# Patient Record
Sex: Female | Born: 1975 | Race: White | Hispanic: No | Marital: Married | Smoking: Never smoker
Health system: Southern US, Community
[De-identification: ages and names within clinical notes are randomized; demographics above are authoritative.]

## PROBLEM LIST (undated history)

## (undated) HISTORY — PX: AUGMENTATION MAMMAPLASTY: SUR837

---

## 2002-02-06 ENCOUNTER — Other Ambulatory Visit: Admission: RE | Admit: 2002-02-06 | Discharge: 2002-02-06 | Payer: Self-pay | Admitting: Obstetrics and Gynecology

## 2003-02-05 ENCOUNTER — Other Ambulatory Visit: Admission: RE | Admit: 2003-02-05 | Discharge: 2003-02-05 | Payer: Self-pay | Admitting: Obstetrics and Gynecology

## 2004-02-29 ENCOUNTER — Other Ambulatory Visit: Admission: RE | Admit: 2004-02-29 | Discharge: 2004-02-29 | Payer: Self-pay | Admitting: Obstetrics and Gynecology

## 2005-06-22 ENCOUNTER — Other Ambulatory Visit: Admission: RE | Admit: 2005-06-22 | Discharge: 2005-06-22 | Payer: Self-pay | Admitting: Obstetrics and Gynecology

## 2007-07-09 ENCOUNTER — Encounter (INDEPENDENT_AMBULATORY_CARE_PROVIDER_SITE_OTHER): Payer: Self-pay | Admitting: Obstetrics and Gynecology

## 2007-07-09 ENCOUNTER — Ambulatory Visit (HOSPITAL_COMMUNITY): Admission: RE | Admit: 2007-07-09 | Discharge: 2007-07-09 | Payer: Self-pay | Admitting: Obstetrics and Gynecology

## 2008-06-25 ENCOUNTER — Ambulatory Visit (HOSPITAL_COMMUNITY): Admission: RE | Admit: 2008-06-25 | Discharge: 2008-06-25 | Payer: Self-pay | Admitting: Obstetrics and Gynecology

## 2008-06-25 ENCOUNTER — Encounter (INDEPENDENT_AMBULATORY_CARE_PROVIDER_SITE_OTHER): Payer: Self-pay | Admitting: Obstetrics and Gynecology

## 2010-08-14 ENCOUNTER — Ambulatory Visit (HOSPITAL_COMMUNITY): Admission: AD | Admit: 2010-08-14 | Discharge: 2010-08-15 | Payer: Self-pay | Admitting: Obstetrics and Gynecology

## 2010-08-28 DEATH — deceased

## 2010-12-09 LAB — CBC
Hemoglobin: 15.2 g/dL — ABNORMAL HIGH (ref 12.0–15.0)
MCH: 31.8 pg (ref 26.0–34.0)
MCHC: 34.6 g/dL (ref 30.0–36.0)
RDW: 11.8 % (ref 11.5–15.5)

## 2011-02-10 NOTE — Op Note (Signed)
Kaitlin Curtis, TREVIZO            ACCOUNT NO.:  [id number]   MEDICAL RECORD NO.:  [id number]          PATIENT TYPE:  AMB   LOCATION:  [facility]                           FACILITY:  [facility]   PHYSICIAN:  Michelle L. 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   PROCEDURE:  Dilatation and evacuation with chromosome.   SURGEON:  Michelle L. Vincente Poli, M.D.   ANESTHESIA:  MAC with local.   FINDINGS:  Products of conception.   OPERATIVE PROCEDURE:  The patient was taken to the operating room.  She  was given her anesthesia.  She is prepped and draped.  A speculum was  inserted into the vagina.  A paracervical block was performed in  standard fashion.  The cervical internal os was gently dilated using  Pratt dilators.  A #7 suction cannula was inserted and suction curettage  was performed with retrieval of contents consistent with products of  conception.  The curette was removed.  A sharp curette was inserted and  the uterus was thoroughly curetted of all tissue.  A final suction  curettage was performed and the cavity was clean.  At the end of the  procedure, there was no uterine bleeding noted.  All instruments were  removed from the vagina.  The patient tolerated the procedure extremely  well.  A portion of the specimen was sent for routine pathology.  Another portion was sent for chromosome karyotype because of the concern  of possible molar pregnancy given ultrasound appearance of the products  of conception.  Estimated blood loss was 50 mL.  The patient tolerated  the procedure well and went to the recovery room in stable condition.  All sponge, lap and instrument counts were correct x2.      Michelle L. Vincente Poli, M.D.  Electronically Signed     MLG/MEDQ  D:  07/09/2007  T:   07/09/2007  Job:  [id number]

## 2011-02-10 NOTE — Op Note (Signed)
NAMENASHIA, Kaitlin Curtis            ACCOUNT NO.:  [id number]   MEDICAL RECORD NO.:  [id number]          PATIENT TYPE:  AMB   LOCATION:  [facility]C                           [facility]   PHYSICIAN:  Michelle L. Grewal, M.D.DATE OF BIRTH:  July 22, 1976   DATE OF PROCEDURE:  06/25/2008  DATE OF DISCHARGE:                               OPERATIVE REPORT   PREOPERATIVE DIAGNOSES:  Missed abortion, Rh positive.   POSTOPERATIVE DIAGNOSES:  Missed abortion, Rh positive.   PROCEDURE:  Dilatation and evacuation with chromosomes.   SURGEON:  Michelle L. Vincente Poli, MD.   ANESTHESIA:  MAC with local.   FINDINGS:  Products of conception sent to pathology.   ESTIMATED BLOOD LOSS:  Minimal.   COMPLICATIONS:  None.   DESCRIPTION OF PROCEDURE:  The patient was taken to the operating room.  She was given sedation.  She was placed in the lithotomy position.  In-  and-out catheter was used to empty the bladder.  A speculum was inserted  into the vagina.  Cervix was grasped with a tenaculum.  A paracervical  block was performed in standard fashion.  A #7-suction cannula was  inserted and a suction curettage was performed with retrieval tissue  consistent with POC.  Also, there were some old clots at the top of her  uterus consistent with her subchorionic hemorrhage.  The cannula was  removed.  A sharp curette was inserted.  The uterus was thoroughly  curetted of all tissue.  A final suction curettage was performed.  The  uterine cavity was cleaned.  All sponge, lap, and instrument counts were  correct x2.  All instruments have been removed from the vagina.  Half of  the tissue was sent for chromosome karyotype.  The patient tolerated  procedure very well.      Michelle L. Vincente Poli, M.D.  Electronically Signed     MLG/MEDQ  D:  06/25/2008  T:  06/26/2008  Job:  [id number]

## 2011-02-18 ENCOUNTER — Other Ambulatory Visit: Payer: Self-pay | Admitting: Obstetrics and Gynecology

## 2011-02-18 ENCOUNTER — Ambulatory Visit (HOSPITAL_COMMUNITY)
Admission: RE | Admit: 2011-02-18 | Discharge: 2011-02-18 | Disposition: A | Discharge status: Home / Self Care | Payer: BC Managed Care – PPO | Source: Ambulatory Visit | Attending: Obstetrics and Gynecology | Admitting: Obstetrics and Gynecology

## 2011-02-18 ENCOUNTER — Ambulatory Visit (HOSPITAL_COMMUNITY): Payer: BC Managed Care – PPO

## 2011-02-18 DIAGNOSIS — O039 Complete or unspecified spontaneous abortion without complication: Secondary | ICD-10-CM

## 2011-02-18 DIAGNOSIS — O021 Missed abortion: Secondary | ICD-10-CM | POA: Insufficient documentation

## 2011-02-18 LAB — CBC
MCH: 31.2 pg (ref 26.0–34.0)
MCV: 90.8 fL (ref 78.0–100.0)
Platelets: 201 10*3/uL (ref 150–400)
RDW: 12.3 % (ref 11.5–15.5)
WBC: 5 10*3/uL (ref 4.0–10.5)

## 2011-02-20 NOTE — Op Note (Signed)
  NAMETENNA, LACKO            ACCOUNT NO.:  [id number]  MEDICAL RECORD NO.:  [id number]           PATIENT TYPE:  O  LOCATION:  [facility]                          [facility]  PHYSICIAN:  Naethan Bracewell L. Makiya Jeune, M.D.DATE OF BIRTH:  1976/03/24  DATE OF PROCEDURE:  02/18/2011 DATE OF DISCHARGE:                              OPERATIVE REPORT   PREOPERATIVE DIAGNOSES:  Missed abortion, Rh positive, 9 weeks' pregnant and recurrent pregnancy loss.  POSTOPERATIVE DIAGNOSES:  Missed abortion, Rh positive, 9 weeks' pregnant and recurrent pregnancy loss.  PROCEDURE:  D and E with ultrasound guidance and chromosome analysis.  SURGEON:  Haddy Mullinax L. Vincente Poli, MD  ANESTHESIA:  MAC with paracervical block.  PROCEDURE:  The patient was taken to the operating room.  She was administered anesthesia.  She was prepped and draped.  In-and-out catheter was used to empty the bladder.  The speculum was inserted into the vagina.  The cervix was grasped with a tenaculum and a paracervical block was performed in standard fashion.  The cervical internal os was gently dilated with Shawnie Pons dilators under direct ultrasound visualization and an #8 suction cannula was inserted into the uterine cavity and with using ultrasound as direct guidance, a thorough suction curettage was performed with retrieval contents consistent with products of conception.  A sharp curette was inserted into the uterus.  The uterus was thoroughly cleaned of all tissue.  Bleeding was minimal.  The patient tolerated the procedure well.  All sponge, lap and instrument counts were correct x2.  She went to recovery room in stable condition.     Yahmir Sokolov L. Vincente Poli, M.D.     Florestine Avers  D:  02/18/2011  T:  02/18/2011  Job:  [id number]  Electronically Signed by Marcelle Overlie M.D. on 02/20/2011 07:19:27 AM

## 2011-02-27 DEATH — deceased

## 2011-06-29 LAB — CBC
Platelets: 243
RDW: 11.7
WBC: 5.6

## 2011-07-09 LAB — CBC
HCT: 42.7
Hemoglobin: 14.8
RBC: 4.65

## 2012-09-14 ENCOUNTER — Other Ambulatory Visit: Payer: Self-pay | Admitting: Obstetrics and Gynecology

## 2013-09-15 ENCOUNTER — Other Ambulatory Visit: Payer: Self-pay | Admitting: Obstetrics and Gynecology

## 2014-09-17 ENCOUNTER — Other Ambulatory Visit: Payer: Self-pay | Admitting: Obstetrics and Gynecology

## 2014-09-18 LAB — CYTOLOGY - PAP

## 2014-10-16 ENCOUNTER — Other Ambulatory Visit: Payer: Self-pay | Admitting: Obstetrics and Gynecology

## 2014-10-16 DIAGNOSIS — R928 Other abnormal and inconclusive findings on diagnostic imaging of breast: Secondary | ICD-10-CM

## 2014-10-23 ENCOUNTER — Ambulatory Visit
Admission: RE | Admit: 2014-10-23 | Discharge: 2014-10-23 | Disposition: A | Discharge status: Home / Self Care | Payer: BLUE CROSS/BLUE SHIELD | Source: Ambulatory Visit | Attending: Obstetrics and Gynecology | Admitting: Obstetrics and Gynecology

## 2014-10-23 DIAGNOSIS — R928 Other abnormal and inconclusive findings on diagnostic imaging of breast: Secondary | ICD-10-CM

## 2015-12-30 DIAGNOSIS — H60392 Other infective otitis externa, left ear: Secondary | ICD-10-CM | POA: Diagnosis not present

## 2015-12-30 DIAGNOSIS — H66002 Acute suppurative otitis media without spontaneous rupture of ear drum, left ear: Secondary | ICD-10-CM | POA: Diagnosis not present

## 2016-02-18 DIAGNOSIS — Z111 Encounter for screening for respiratory tuberculosis: Secondary | ICD-10-CM | POA: Diagnosis not present

## 2016-04-06 DIAGNOSIS — B078 Other viral warts: Secondary | ICD-10-CM | POA: Diagnosis not present

## 2016-07-20 DIAGNOSIS — L720 Epidermal cyst: Secondary | ICD-10-CM | POA: Diagnosis not present

## 2016-08-06 DIAGNOSIS — M25562 Pain in left knee: Secondary | ICD-10-CM | POA: Diagnosis not present

## 2016-08-06 DIAGNOSIS — Z681 Body mass index (BMI) 19 or less, adult: Secondary | ICD-10-CM | POA: Diagnosis not present

## 2016-09-01 ENCOUNTER — Encounter: Payer: Self-pay | Admitting: Sports Medicine

## 2016-09-01 ENCOUNTER — Ambulatory Visit (INDEPENDENT_AMBULATORY_CARE_PROVIDER_SITE_OTHER): Payer: BLUE CROSS/BLUE SHIELD | Admitting: Sports Medicine

## 2016-09-01 ENCOUNTER — Ambulatory Visit: Payer: Self-pay

## 2016-09-01 VITALS — BP 123/60 | Ht 64.5 in | Wt 110.0 lb

## 2016-09-01 DIAGNOSIS — M25562 Pain in left knee: Secondary | ICD-10-CM | POA: Diagnosis not present

## 2016-09-01 DIAGNOSIS — L700 Acne vulgaris: Secondary | ICD-10-CM | POA: Diagnosis not present

## 2016-09-01 DIAGNOSIS — D1801 Hemangioma of skin and subcutaneous tissue: Secondary | ICD-10-CM | POA: Diagnosis not present

## 2016-09-01 DIAGNOSIS — D2239 Melanocytic nevi of other parts of face: Secondary | ICD-10-CM | POA: Diagnosis not present

## 2016-09-01 DIAGNOSIS — M7052 Other bursitis of knee, left knee: Secondary | ICD-10-CM

## 2016-09-01 DIAGNOSIS — M25561 Pain in right knee: Secondary | ICD-10-CM | POA: Diagnosis not present

## 2016-09-01 DIAGNOSIS — L308 Other specified dermatitis: Secondary | ICD-10-CM | POA: Diagnosis not present

## 2016-09-01 DIAGNOSIS — R269 Unspecified abnormalities of gait and mobility: Secondary | ICD-10-CM

## 2016-09-01 NOTE — Assessment & Plan Note (Signed)
Eccentric exercise protocol Emphasize calf raises Emphasize hamstring with diver and extender  Be cautious with excess stretching as this is more likely injured in yoga  Use compression sleeve  Gradually resume running over the next 6 weeks and return for recheck

## 2016-09-01 NOTE — Assessment & Plan Note (Signed)
Her pronation and genu valgus may have contributed to this not healing  Sports insoles Scaphoid pad added  This improved her gait and controlled her pronation

## 2016-09-01 NOTE — Progress Notes (Signed)
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 Palpation normal with no warmth or joint line tenderness or patellar tenderness or condyle tenderness. ROM normal in flexion and extension and lower leg rotation. Ligaments with solid consistent endpoints including ACL, PCL, LCL, MCL. Negative Mcmurray's and provocative meniscal tests. Non painful patellar compression. Patellar and quadriceps tendons unremarkable. Hamstring and quadriceps strength is normal.  Deep palpation of the hamstring and calf muscles does not reveal any pain  Feet There is flattening of the transverse arch on the right Separation of the medial and lateral column  On the left there is splaying between toes 1 and 2 Rotation of the lateral column  Prominent first MTP joints bilaterally  Running gait She has turning out of both feet more on the left This causes dynamic pronation of the midfoot and forefoot There is mild dynamic genu valgus   Ultrasound examination of the left knee Suprapatellar pouch normal with no effusion Quadriceps and patellar tendons normal Medial and lateral menisci normal The popliteal space  shows bursal swelling well localized between the insertion of the gastrocnemius and the semimembranosus in the popliteal space Hypoechoic bursal swelling measures 1 x 3 cm  Summary: ultrasound is consistent with gastrocnemius-semimembranosus bursitis

## 2016-09-08 DIAGNOSIS — Z0000 Encounter for general adult medical examination without abnormal findings: Secondary | ICD-10-CM | POA: Diagnosis not present

## 2016-09-08 DIAGNOSIS — Z681 Body mass index (BMI) 19 or less, adult: Secondary | ICD-10-CM | POA: Diagnosis not present

## 2016-09-08 DIAGNOSIS — M542 Cervicalgia: Secondary | ICD-10-CM | POA: Diagnosis not present

## 2016-09-08 DIAGNOSIS — R208 Other disturbances of skin sensation: Secondary | ICD-10-CM | POA: Diagnosis not present

## 2016-09-09 DIAGNOSIS — R209 Unspecified disturbances of skin sensation: Secondary | ICD-10-CM | POA: Diagnosis not present

## 2016-10-19 DIAGNOSIS — Z681 Body mass index (BMI) 19 or less, adult: Secondary | ICD-10-CM | POA: Diagnosis not present

## 2016-10-19 DIAGNOSIS — Z01419 Encounter for gynecological examination (general) (routine) without abnormal findings: Secondary | ICD-10-CM | POA: Diagnosis not present

## 2016-10-19 DIAGNOSIS — Z1231 Encounter for screening mammogram for malignant neoplasm of breast: Secondary | ICD-10-CM | POA: Diagnosis not present

## 2016-10-20 ENCOUNTER — Ambulatory Visit (INDEPENDENT_AMBULATORY_CARE_PROVIDER_SITE_OTHER): Payer: BLUE CROSS/BLUE SHIELD | Admitting: Sports Medicine

## 2016-10-20 ENCOUNTER — Encounter: Payer: Self-pay | Admitting: Sports Medicine

## 2016-10-20 ENCOUNTER — Other Ambulatory Visit: Payer: Self-pay | Admitting: *Deleted

## 2016-10-20 DIAGNOSIS — R269 Unspecified abnormalities of gait and mobility: Secondary | ICD-10-CM

## 2016-10-20 DIAGNOSIS — M7052 Other bursitis of knee, left knee: Secondary | ICD-10-CM

## 2016-10-20 NOTE — Assessment & Plan Note (Signed)
Much improved gait mechanics with inserts   Continue to use these  Reck 6 weeks

## 2016-10-20 NOTE — Patient Instructions (Signed)
Hamstring curls and swings Gradually add weight  Extender exercise use ankle weight and gradually increase number  Backpack with 10 bls to start and do you 1 leg heel raises on a step  Run easy but feel free to start doing this as long as no limp no progressive pain  6 weeks let's repeat scan and see if this has healed

## 2016-10-20 NOTE — Assessment & Plan Note (Signed)
This is steadily improving A lot of progress in 6 weeks  Expect she should be ready to go back to full training in 6 more weeks of HEP and insert usage

## 2016-10-20 NOTE — Progress Notes (Signed)
F/u: Left calf/ post. Knee pain  Patient followed for what we found to be semimembranosus- gastrocnemius burisits on [place].  Presnted as post. Knee pain. Now pain has stopped at rest. No pain at night. Sharp pains have stopped.  With running able to go on short runs with minimal pain. Has not tried longer runs yet.  Doing rehab exercises daily with no pain. Now able to RT yoga and pilates with no pain.  ROS No locking No giving way No swelling of knee  PE Fit, thin W F in NAD BP 135/75   Pulse 67   Ht 5' 4.5" (1.638 m)   Wt 110 lb (49.9 kg)   BMI 18.59 kg/m   Left Knee: Normal to inspection with no erythema or effusion or obvious bony abnormalities. Palpation normal with no warmth or joint line tenderness or patellar tenderness or condyle tenderness. ROM normal in flexion and extension and lower leg rotation. Ligaments with solid consistent endpoints including ACL, PCL, LCL, MCL. Negative Mcmurray's and provocative meniscal tests. Non painful patellar compression. Patellar and quadriceps tendons unremarkable. Hamstring and quadriceps strength is normal.  Strength testing is good. Feels tightness on HS resistance Calf raise w no tightness Hip abd. Strong  Running gait shows that inserts are controlling the excess pronation and looks improved - no real genu valgus today

## 2016-11-01 DIAGNOSIS — J019 Acute sinusitis, unspecified: Secondary | ICD-10-CM | POA: Diagnosis not present

## 2016-11-02 DIAGNOSIS — L308 Other specified dermatitis: Secondary | ICD-10-CM | POA: Diagnosis not present

## 2016-11-02 DIAGNOSIS — L738 Other specified follicular disorders: Secondary | ICD-10-CM | POA: Diagnosis not present

## 2016-11-02 DIAGNOSIS — L282 Other prurigo: Secondary | ICD-10-CM | POA: Diagnosis not present

## 2016-11-02 DIAGNOSIS — R21 Rash and other nonspecific skin eruption: Secondary | ICD-10-CM | POA: Diagnosis not present

## 2016-11-05 DIAGNOSIS — H60502 Unspecified acute noninfective otitis externa, left ear: Secondary | ICD-10-CM | POA: Diagnosis not present

## 2016-11-05 DIAGNOSIS — J018 Other acute sinusitis: Secondary | ICD-10-CM | POA: Diagnosis not present

## 2016-11-30 DIAGNOSIS — L738 Other specified follicular disorders: Secondary | ICD-10-CM | POA: Diagnosis not present

## 2016-11-30 DIAGNOSIS — L282 Other prurigo: Secondary | ICD-10-CM | POA: Diagnosis not present

## 2016-11-30 DIAGNOSIS — L308 Other specified dermatitis: Secondary | ICD-10-CM | POA: Diagnosis not present

## 2016-12-17 ENCOUNTER — Ambulatory Visit: Payer: Self-pay

## 2016-12-17 ENCOUNTER — Encounter: Payer: Self-pay | Admitting: Sports Medicine

## 2016-12-17 ENCOUNTER — Ambulatory Visit (INDEPENDENT_AMBULATORY_CARE_PROVIDER_SITE_OTHER): Payer: BLUE CROSS/BLUE SHIELD | Admitting: Sports Medicine

## 2016-12-17 ENCOUNTER — Encounter (INDEPENDENT_AMBULATORY_CARE_PROVIDER_SITE_OTHER): Payer: Self-pay

## 2016-12-17 VITALS — BP 120/77 | HR 75 | Ht 64.5 in | Wt 110.0 lb

## 2016-12-17 DIAGNOSIS — M7052 Other bursitis of knee, left knee: Secondary | ICD-10-CM

## 2016-12-17 DIAGNOSIS — R269 Unspecified abnormalities of gait and mobility: Secondary | ICD-10-CM

## 2016-12-17 NOTE — Progress Notes (Signed)
CC: left post knee pain  F/U of semimembranosus/ gastroc bursitis Walking w no pain up to 4 miles No pain on squats No pain with biking or easy step classes Still feels tight when she tries to run Back for reck  ROS No knee locking No giving way No swelling  PE Thin, athletic F BP 120/77   Pulse 75   Ht 5' 4.5" (1.638 m)   Wt 110 lb (49.9 kg)   BMI 18.59 kg/m   Knee:Left Normal to inspection with no erythema or effusion or obvious bony abnormalities. Palpation normal with no warmth or joint line tenderness or patellar tenderness or condyle tenderness. ROM normal in flexion and extension and lower leg rotation. Ligaments with solid consistent endpoints including ACL, PCL, LCL, MCL. Negative Mcmurray's and provocative meniscal tests. Non painful patellar compression. Patellar and quadriceps tendons unremarkable. Hamstring and quadriceps strength is normal.  Calf non tender Good strength n o bakers cyst  Running gait is neutral  Ultrasound of Left Calf and Knee  The gastrocnemius is scanned from mid calf to insertion behind knee There is some hyperechoic change in proximal tendon Semimembranosus and tendinosus visualized to distal insertion and no tear or abnormalituy Bursal swelling has resolved  Complete scan of knee was unremarkable  Impression - resolving gastrocnemius/ semimembranosus bursitis with residual scar tissue in proximal gastrocnemius  Ultrasound and interpretation by Wolfgang Phoenix. Oneida Alar, MD

## 2016-12-17 NOTE — Assessment & Plan Note (Signed)
Cont with insoles as they seem to control gait

## 2016-12-17 NOTE — Assessment & Plan Note (Signed)
This is much improved on exam and [place]  Keep up calf raises  Wear compression  Ease back into run/walk as long as pain < 3/10

## 2017-02-16 DIAGNOSIS — Z111 Encounter for screening for respiratory tuberculosis: Secondary | ICD-10-CM | POA: Diagnosis not present

## 2017-03-29 DIAGNOSIS — H60392 Other infective otitis externa, left ear: Secondary | ICD-10-CM | POA: Diagnosis not present

## 2017-04-06 DIAGNOSIS — H66002 Acute suppurative otitis media without spontaneous rupture of ear drum, left ear: Secondary | ICD-10-CM | POA: Diagnosis not present

## 2017-04-06 DIAGNOSIS — H60392 Other infective otitis externa, left ear: Secondary | ICD-10-CM | POA: Diagnosis not present

## 2017-09-30 DIAGNOSIS — R208 Other disturbances of skin sensation: Secondary | ICD-10-CM | POA: Diagnosis not present

## 2017-09-30 DIAGNOSIS — M542 Cervicalgia: Secondary | ICD-10-CM | POA: Diagnosis not present

## 2017-09-30 DIAGNOSIS — G5601 Carpal tunnel syndrome, right upper limb: Secondary | ICD-10-CM | POA: Diagnosis not present

## 2017-09-30 DIAGNOSIS — Z681 Body mass index (BMI) 19 or less, adult: Secondary | ICD-10-CM | POA: Diagnosis not present

## 2017-10-22 DIAGNOSIS — Z1231 Encounter for screening mammogram for malignant neoplasm of breast: Secondary | ICD-10-CM | POA: Diagnosis not present

## 2017-10-22 DIAGNOSIS — Z681 Body mass index (BMI) 19 or less, adult: Secondary | ICD-10-CM | POA: Diagnosis not present

## 2017-10-22 DIAGNOSIS — Z01419 Encounter for gynecological examination (general) (routine) without abnormal findings: Secondary | ICD-10-CM | POA: Diagnosis not present

## 2017-10-22 DIAGNOSIS — Z1212 Encounter for screening for malignant neoplasm of rectum: Secondary | ICD-10-CM | POA: Diagnosis not present

## 2017-10-27 DIAGNOSIS — L853 Xerosis cutis: Secondary | ICD-10-CM | POA: Diagnosis not present

## 2017-10-27 DIAGNOSIS — D225 Melanocytic nevi of trunk: Secondary | ICD-10-CM | POA: Diagnosis not present

## 2017-10-27 DIAGNOSIS — D2262 Melanocytic nevi of left upper limb, including shoulder: Secondary | ICD-10-CM | POA: Diagnosis not present

## 2017-10-27 DIAGNOSIS — L700 Acne vulgaris: Secondary | ICD-10-CM | POA: Diagnosis not present

## 2017-11-17 DIAGNOSIS — H66002 Acute suppurative otitis media without spontaneous rupture of ear drum, left ear: Secondary | ICD-10-CM | POA: Diagnosis not present

## 2017-11-17 DIAGNOSIS — J0140 Acute pansinusitis, unspecified: Secondary | ICD-10-CM | POA: Diagnosis not present

## 2018-02-16 DIAGNOSIS — Z111 Encounter for screening for respiratory tuberculosis: Secondary | ICD-10-CM | POA: Diagnosis not present

## 2018-03-08 DIAGNOSIS — Z111 Encounter for screening for respiratory tuberculosis: Secondary | ICD-10-CM | POA: Diagnosis not present

## 2018-06-02 DIAGNOSIS — J0100 Acute maxillary sinusitis, unspecified: Secondary | ICD-10-CM | POA: Diagnosis not present

## 2018-06-02 DIAGNOSIS — Z23 Encounter for immunization: Secondary | ICD-10-CM | POA: Diagnosis not present

## 2018-06-27 DIAGNOSIS — M6281 Muscle weakness (generalized): Secondary | ICD-10-CM | POA: Diagnosis not present

## 2018-06-27 DIAGNOSIS — M2560 Stiffness of unspecified joint, not elsewhere classified: Secondary | ICD-10-CM | POA: Diagnosis not present

## 2018-06-27 DIAGNOSIS — G5620 Lesion of ulnar nerve, unspecified upper limb: Secondary | ICD-10-CM | POA: Diagnosis not present

## 2018-06-27 DIAGNOSIS — M542 Cervicalgia: Secondary | ICD-10-CM | POA: Diagnosis not present

## 2018-07-01 DIAGNOSIS — M542 Cervicalgia: Secondary | ICD-10-CM | POA: Diagnosis not present

## 2018-07-01 DIAGNOSIS — M2560 Stiffness of unspecified joint, not elsewhere classified: Secondary | ICD-10-CM | POA: Diagnosis not present

## 2018-07-01 DIAGNOSIS — G5620 Lesion of ulnar nerve, unspecified upper limb: Secondary | ICD-10-CM | POA: Diagnosis not present

## 2018-07-01 DIAGNOSIS — M6281 Muscle weakness (generalized): Secondary | ICD-10-CM | POA: Diagnosis not present

## 2018-07-04 DIAGNOSIS — G5620 Lesion of ulnar nerve, unspecified upper limb: Secondary | ICD-10-CM | POA: Diagnosis not present

## 2018-07-04 DIAGNOSIS — M542 Cervicalgia: Secondary | ICD-10-CM | POA: Diagnosis not present

## 2018-07-04 DIAGNOSIS — M6281 Muscle weakness (generalized): Secondary | ICD-10-CM | POA: Diagnosis not present

## 2018-07-04 DIAGNOSIS — M2560 Stiffness of unspecified joint, not elsewhere classified: Secondary | ICD-10-CM | POA: Diagnosis not present

## 2018-07-08 DIAGNOSIS — M542 Cervicalgia: Secondary | ICD-10-CM | POA: Diagnosis not present

## 2018-07-08 DIAGNOSIS — M2560 Stiffness of unspecified joint, not elsewhere classified: Secondary | ICD-10-CM | POA: Diagnosis not present

## 2018-07-08 DIAGNOSIS — G5620 Lesion of ulnar nerve, unspecified upper limb: Secondary | ICD-10-CM | POA: Diagnosis not present

## 2018-07-08 DIAGNOSIS — M6281 Muscle weakness (generalized): Secondary | ICD-10-CM | POA: Diagnosis not present

## 2018-07-15 DIAGNOSIS — G5620 Lesion of ulnar nerve, unspecified upper limb: Secondary | ICD-10-CM | POA: Diagnosis not present

## 2018-07-15 DIAGNOSIS — M542 Cervicalgia: Secondary | ICD-10-CM | POA: Diagnosis not present

## 2018-07-15 DIAGNOSIS — M6281 Muscle weakness (generalized): Secondary | ICD-10-CM | POA: Diagnosis not present

## 2018-07-15 DIAGNOSIS — M2560 Stiffness of unspecified joint, not elsewhere classified: Secondary | ICD-10-CM | POA: Diagnosis not present

## 2018-09-19 DIAGNOSIS — J069 Acute upper respiratory infection, unspecified: Secondary | ICD-10-CM | POA: Diagnosis not present

## 2018-10-09 DIAGNOSIS — R05 Cough: Secondary | ICD-10-CM | POA: Diagnosis not present

## 2018-10-09 DIAGNOSIS — J069 Acute upper respiratory infection, unspecified: Secondary | ICD-10-CM | POA: Diagnosis not present

## 2018-11-18 DIAGNOSIS — R1903 Right lower quadrant abdominal swelling, mass and lump: Secondary | ICD-10-CM | POA: Diagnosis not present

## 2018-11-18 DIAGNOSIS — Z01419 Encounter for gynecological examination (general) (routine) without abnormal findings: Secondary | ICD-10-CM | POA: Diagnosis not present

## 2018-11-18 DIAGNOSIS — Z1212 Encounter for screening for malignant neoplasm of rectum: Secondary | ICD-10-CM | POA: Diagnosis not present

## 2018-11-18 DIAGNOSIS — Z1231 Encounter for screening mammogram for malignant neoplasm of breast: Secondary | ICD-10-CM | POA: Diagnosis not present

## 2018-11-18 DIAGNOSIS — R1031 Right lower quadrant pain: Secondary | ICD-10-CM | POA: Diagnosis not present

## 2018-11-18 DIAGNOSIS — Z681 Body mass index (BMI) 19 or less, adult: Secondary | ICD-10-CM | POA: Diagnosis not present

## 2018-11-22 ENCOUNTER — Other Ambulatory Visit: Payer: Self-pay | Admitting: Obstetrics and Gynecology

## 2018-11-22 DIAGNOSIS — R928 Other abnormal and inconclusive findings on diagnostic imaging of breast: Secondary | ICD-10-CM

## 2018-11-24 ENCOUNTER — Ambulatory Visit
Admission: RE | Admit: 2018-11-24 | Discharge: 2018-11-24 | Disposition: A | Discharge status: Home / Self Care | Payer: BLUE CROSS/BLUE SHIELD | Source: Ambulatory Visit | Attending: Obstetrics and Gynecology | Admitting: Obstetrics and Gynecology

## 2018-11-24 ENCOUNTER — Other Ambulatory Visit: Payer: Self-pay | Admitting: Obstetrics and Gynecology

## 2018-11-24 DIAGNOSIS — R922 Inconclusive mammogram: Secondary | ICD-10-CM | POA: Diagnosis not present

## 2018-11-24 DIAGNOSIS — N6489 Other specified disorders of breast: Secondary | ICD-10-CM | POA: Diagnosis not present

## 2018-11-24 DIAGNOSIS — R928 Other abnormal and inconclusive findings on diagnostic imaging of breast: Secondary | ICD-10-CM

## 2018-11-25 ENCOUNTER — Other Ambulatory Visit: Payer: BLUE CROSS/BLUE SHIELD

## 2019-03-01 DIAGNOSIS — D2239 Melanocytic nevi of other parts of face: Secondary | ICD-10-CM | POA: Diagnosis not present

## 2019-03-01 DIAGNOSIS — D2261 Melanocytic nevi of right upper limb, including shoulder: Secondary | ICD-10-CM | POA: Diagnosis not present

## 2019-03-01 DIAGNOSIS — L720 Epidermal cyst: Secondary | ICD-10-CM | POA: Diagnosis not present

## 2019-03-01 DIAGNOSIS — D2262 Melanocytic nevi of left upper limb, including shoulder: Secondary | ICD-10-CM | POA: Diagnosis not present

## 2019-04-08 DIAGNOSIS — H66002 Acute suppurative otitis media without spontaneous rupture of ear drum, left ear: Secondary | ICD-10-CM | POA: Diagnosis not present

## 2019-04-08 DIAGNOSIS — B373 Candidiasis of vulva and vagina: Secondary | ICD-10-CM | POA: Diagnosis not present

## 2019-04-08 DIAGNOSIS — H60392 Other infective otitis externa, left ear: Secondary | ICD-10-CM | POA: Diagnosis not present

## 2019-05-02 DIAGNOSIS — B078 Other viral warts: Secondary | ICD-10-CM | POA: Diagnosis not present

## 2019-05-02 DIAGNOSIS — C44719 Basal cell carcinoma of skin of left lower limb, including hip: Secondary | ICD-10-CM | POA: Diagnosis not present

## 2019-06-22 DIAGNOSIS — Z23 Encounter for immunization: Secondary | ICD-10-CM | POA: Diagnosis not present

## 2019-09-19 DIAGNOSIS — M5412 Radiculopathy, cervical region: Secondary | ICD-10-CM | POA: Diagnosis not present

## 2019-09-19 DIAGNOSIS — M542 Cervicalgia: Secondary | ICD-10-CM | POA: Diagnosis not present

## 2019-11-23 DIAGNOSIS — R519 Headache, unspecified: Secondary | ICD-10-CM | POA: Diagnosis not present

## 2019-11-23 DIAGNOSIS — Z01419 Encounter for gynecological examination (general) (routine) without abnormal findings: Secondary | ICD-10-CM | POA: Diagnosis not present

## 2019-11-23 DIAGNOSIS — Z681 Body mass index (BMI) 19 or less, adult: Secondary | ICD-10-CM | POA: Diagnosis not present

## 2019-11-23 DIAGNOSIS — Z1231 Encounter for screening mammogram for malignant neoplasm of breast: Secondary | ICD-10-CM | POA: Diagnosis not present

## 2019-11-27 ENCOUNTER — Other Ambulatory Visit: Payer: Self-pay | Admitting: Obstetrics and Gynecology

## 2019-11-27 DIAGNOSIS — R928 Other abnormal and inconclusive findings on diagnostic imaging of breast: Secondary | ICD-10-CM

## 2019-11-28 ENCOUNTER — Ambulatory Visit
Admission: RE | Admit: 2019-11-28 | Discharge: 2019-11-28 | Disposition: A | Discharge status: Home / Self Care | Payer: BC Managed Care – PPO | Source: Ambulatory Visit | Attending: Obstetrics and Gynecology | Admitting: Obstetrics and Gynecology

## 2019-11-28 ENCOUNTER — Other Ambulatory Visit: Payer: Self-pay | Admitting: Obstetrics and Gynecology

## 2019-11-28 ENCOUNTER — Ambulatory Visit: Payer: Self-pay

## 2019-11-28 ENCOUNTER — Other Ambulatory Visit: Payer: Self-pay

## 2019-11-28 DIAGNOSIS — R928 Other abnormal and inconclusive findings on diagnostic imaging of breast: Secondary | ICD-10-CM

## 2019-11-28 DIAGNOSIS — R922 Inconclusive mammogram: Secondary | ICD-10-CM | POA: Diagnosis not present

## 2019-11-29 DIAGNOSIS — Z111 Encounter for screening for respiratory tuberculosis: Secondary | ICD-10-CM | POA: Diagnosis not present

## 2019-11-29 DIAGNOSIS — Z23 Encounter for immunization: Secondary | ICD-10-CM | POA: Diagnosis not present

## 2020-01-04 DIAGNOSIS — Z23 Encounter for immunization: Secondary | ICD-10-CM | POA: Diagnosis not present

## 2020-04-02 DIAGNOSIS — Z03818 Encounter for observation for suspected exposure to other biological agents ruled out: Secondary | ICD-10-CM | POA: Diagnosis not present

## 2020-04-02 DIAGNOSIS — Z20822 Contact with and (suspected) exposure to covid-19: Secondary | ICD-10-CM | POA: Diagnosis not present

## 2020-06-11 DIAGNOSIS — Z419 Encounter for procedure for purposes other than remedying health state, unspecified: Secondary | ICD-10-CM | POA: Diagnosis not present

## 2020-06-11 DIAGNOSIS — D225 Melanocytic nevi of trunk: Secondary | ICD-10-CM | POA: Diagnosis not present

## 2020-06-11 DIAGNOSIS — D2239 Melanocytic nevi of other parts of face: Secondary | ICD-10-CM | POA: Diagnosis not present

## 2020-06-11 DIAGNOSIS — L820 Inflamed seborrheic keratosis: Secondary | ICD-10-CM | POA: Diagnosis not present

## 2020-06-11 DIAGNOSIS — C44319 Basal cell carcinoma of skin of other parts of face: Secondary | ICD-10-CM | POA: Diagnosis not present

## 2020-06-11 DIAGNOSIS — L821 Other seborrheic keratosis: Secondary | ICD-10-CM | POA: Diagnosis not present

## 2020-07-02 DIAGNOSIS — C44319 Basal cell carcinoma of skin of other parts of face: Secondary | ICD-10-CM | POA: Diagnosis not present

## 2020-07-06 DIAGNOSIS — Z23 Encounter for immunization: Secondary | ICD-10-CM | POA: Diagnosis not present

## 2020-07-17 DIAGNOSIS — C44319 Basal cell carcinoma of skin of other parts of face: Secondary | ICD-10-CM | POA: Diagnosis not present

## 2020-07-17 DIAGNOSIS — S0180XA Unspecified open wound of other part of head, initial encounter: Secondary | ICD-10-CM | POA: Diagnosis not present

## 2020-07-31 IMAGING — MG DIGITAL DIAGNOSTIC UNILATERAL RIGHT MAMMOGRAM WITH TOMO AND CAD
4 series · 4 of 12 positions shown · non-contrast
Comparison: Previous exam(s).

CLINICAL DATA: Recall from screening mammography with
tomosynthesis, possible focal asymmetry involving the UPPER RIGHT
breast at POSTERIOR depth. Patient has indwelling BILATERAL
retropectoral saline implants.

EXAM:
DIGITAL DIAGNOSTIC RIGHT MAMMOGRAM WITH TOMO
ULTRASOUND RIGHT BREAST

[R MLO synth-2D]
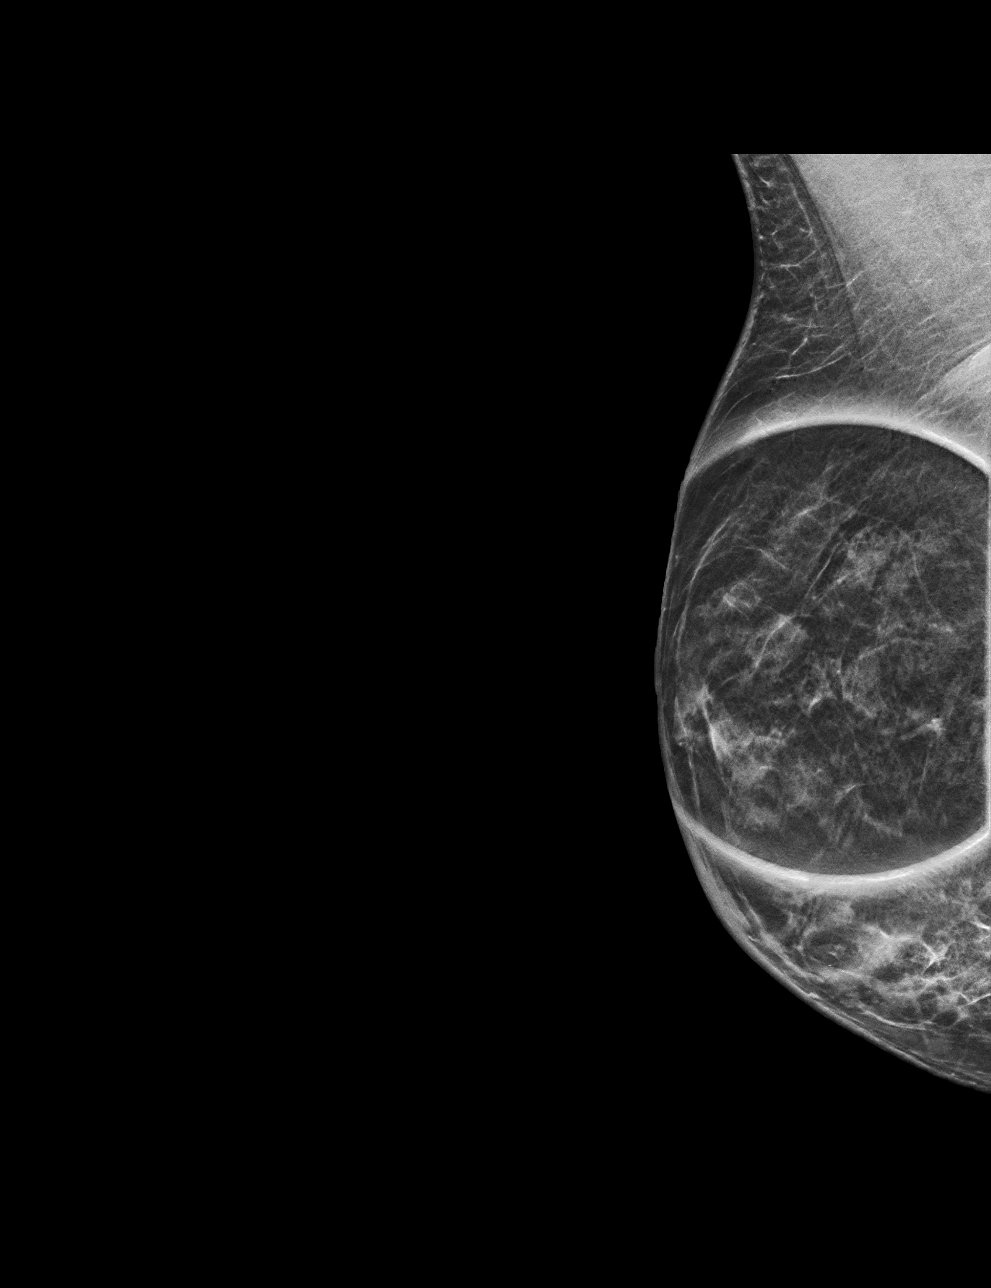

[R CC synth-2D]
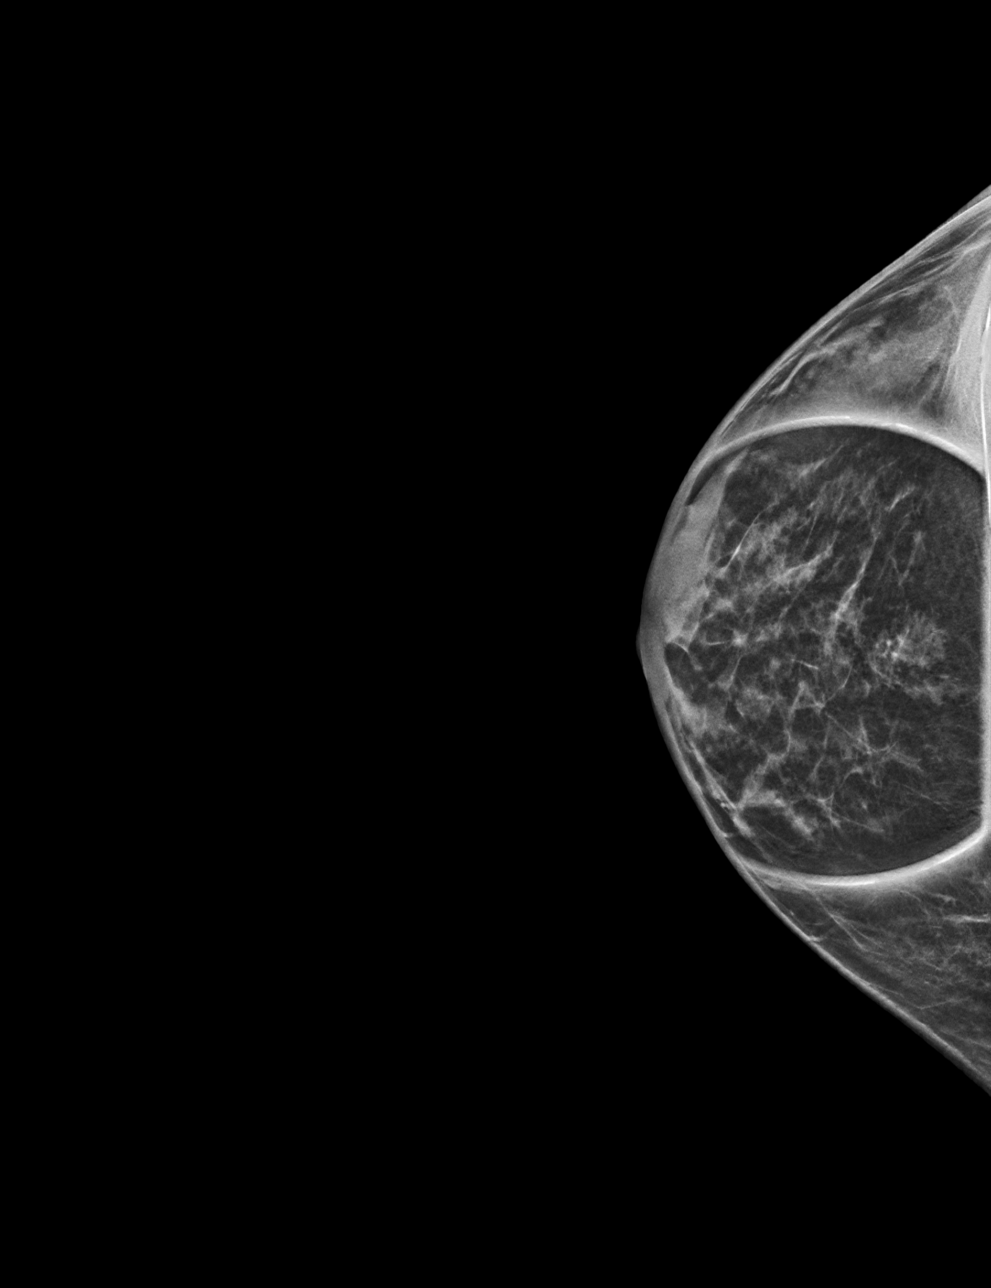

[R MLO tomo · tomo slice 15/28.0]
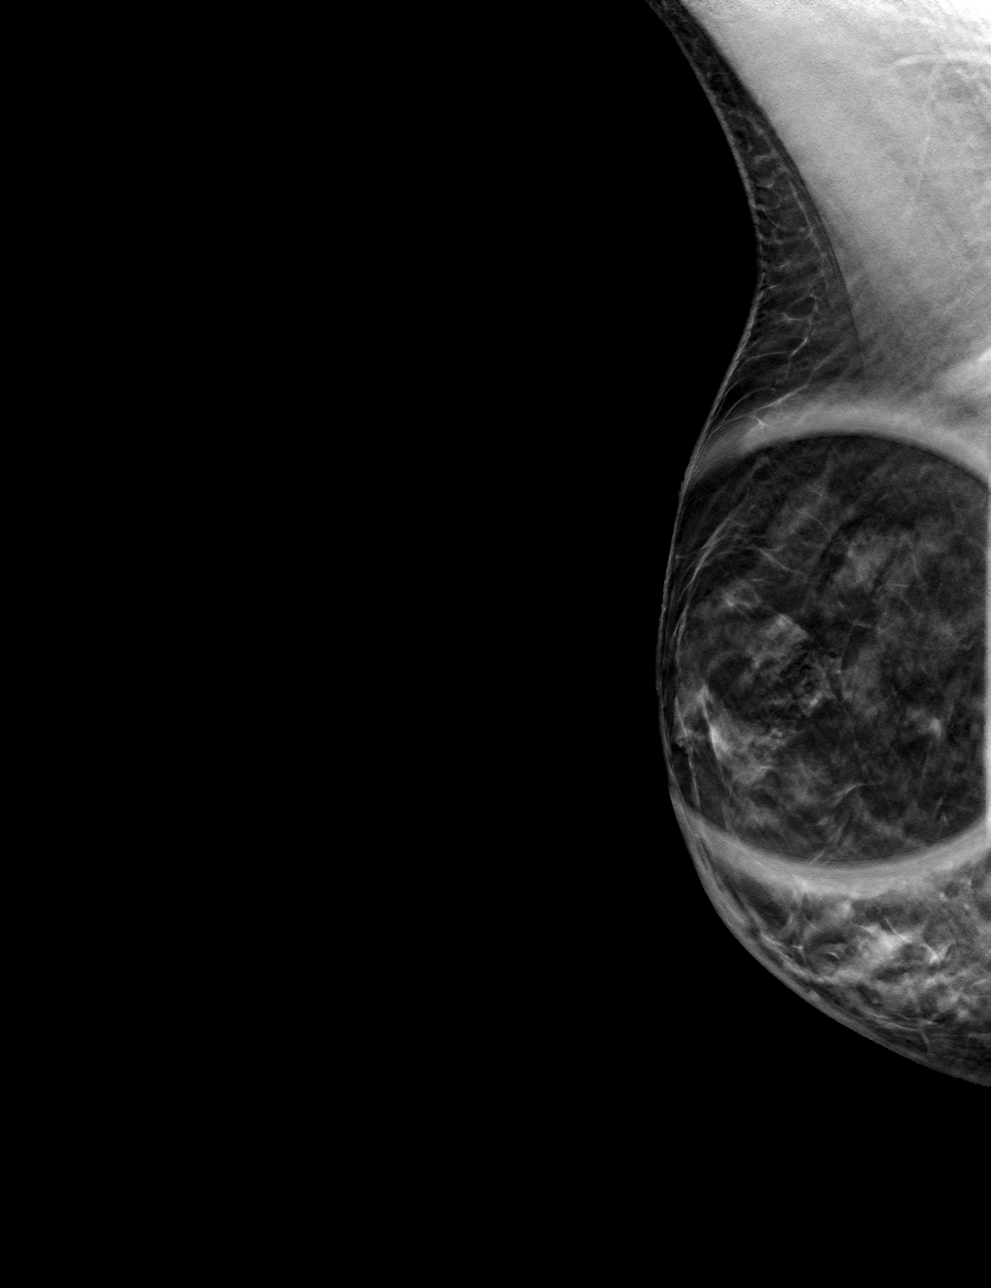

[R CC tomo · tomo slice 15/29.0]
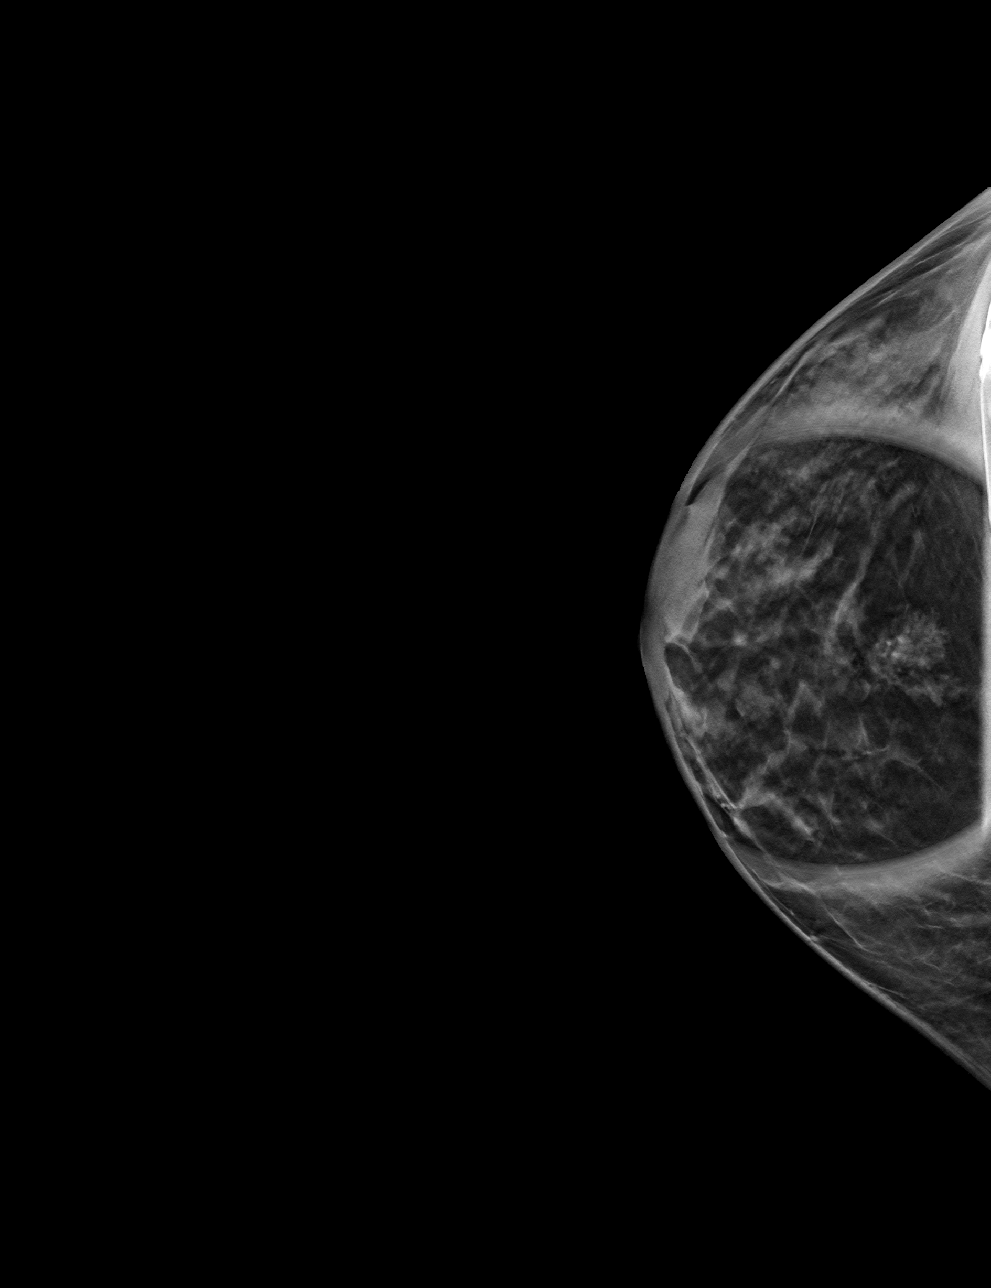

[4 of 12 positions shown; findings below may reference images not displayed]

ACR Breast Density Category c: The breast tissue is heterogeneously
dense, which may obscure small masses.
FINDINGS: Tomosynthesis and synthesized spot-compression CC and MLO views of
the area of concern in the RIGHT breast were obtained.

The focal asymmetry in the UPPER breast at POSTERIOR depth disperses
with compression and there is no underlying mass or architectural
distortion.

Targeted RIGHT breast ultrasound is performed, showing an island of
glandular tissue at the 1 o'clock position approximately 3 cm from
nipple at POSTERIOR depth adjacent to the implant which corresponds
to the screening mammographic finding. No cyst, solid mass or
abnormal acoustic shadowing is identified.
IMPRESSION: 1. No mammographic or sonographic evidence of malignancy involving
the RIGHT breast.
2. Island of normal fibroglandular tissue in the UPPER INNER RIGHT
breast at POSTERIOR depth accounts for the screening mammographic
finding.

RECOMMENDATION:
Screening mammogram in one year.(Code:ZT-K-ELM)

I have discussed the findings and recommendations with the patient.
Results were also provided in writing at the conclusion of the
visit. If applicable, a reminder letter will be sent to the patient
regarding the next appointment.

BI-RADS CATEGORY  1: Negative.

## 2020-08-05 DIAGNOSIS — C44319 Basal cell carcinoma of skin of other parts of face: Secondary | ICD-10-CM | POA: Diagnosis not present

## 2020-08-13 NOTE — Progress Notes (Signed)
Histology and Location of Primary Skin Cancer:   Kaitlin Curtis presented with the following signs/symptoms,  ~2 months ago: felt small raised area that did not resolved, and was then assessed by dermatologist   Past/Anticipated interventions by patient's surgeon/dermatologist for current problematic lesion, if any:  08/05/2020 Dr. Griselda Miner   Past skin cancers, if any:   History of Blistering sunburns, if any: Possibly when younger (she is originally from [place], [place])   SAFETY ISSUES:  Prior radiation? No  Pacemaker/ICD? No  Possible current pregnancy? No  Is the patient on methotrexate? No  Current Complaints / other details:  Patient travels for work (occasionally internationally). She is interested in know the short-term and long-term side effects of radiation vs surgery (especially should recurrence occur later in life)

## 2020-08-14 ENCOUNTER — Ambulatory Visit
Admission: RE | Admit: 2020-08-14 | Discharge: 2020-08-14 | Disposition: A | Discharge status: Home / Self Care | Payer: BC Managed Care – PPO | Source: Ambulatory Visit | Attending: Radiation Oncology | Admitting: Radiation Oncology

## 2020-08-14 ENCOUNTER — Ambulatory Visit: Payer: BC Managed Care – PPO | Admitting: Radiation Oncology

## 2020-08-14 ENCOUNTER — Encounter: Payer: Self-pay | Admitting: Radiation Oncology

## 2020-08-14 DIAGNOSIS — Z85828 Personal history of other malignant neoplasm of skin: Secondary | ICD-10-CM | POA: Diagnosis not present

## 2020-08-14 DIAGNOSIS — C44319 Basal cell carcinoma of skin of other parts of face: Secondary | ICD-10-CM | POA: Insufficient documentation

## 2020-08-14 NOTE — Progress Notes (Signed)
Radiation Oncology         [phone] ________________________________  Initial Outpatient Consultation by MyChart video.  The patient opted for telemedicine to maximize safety during the pandemic.     Name: Kaitlin Curtis MRN: [id number]  Date: 08/14/2020  DOB: 04/26/1976  KP:TWSF, Gwyndolyn Saxon, MD  Griselda Miner, MD   REFERRING PHYSICIAN: Griselda Miner, MD  DIAGNOSIS:    ICD-10-CM   1. Basal cell carcinoma (BCC) of brow  C44.319     Cancer Staging Basal cell carcinoma (BCC) of brow Staging form: Cutaneous Carcinoma of the Head and Neck, AJCC 8th Edition - Clinical stage from 08/14/2020: Stage I (cT1, cN0, cM0) - Signed by Eppie Gibson, MD on 08/14/2020   CHIEF COMPLAINT: Here to discuss management of skin cancer  HISTORY OF PRESENT ILLNESS::Kaitlin Curtis is a 44 y.o. female who presented with abnormal skin lesion on the left superior medial eyebrow.  It presented as a small raised area that did not resolve, prompting her to see her dermatologist.  Biopsy on 06/11/2020 revealed basal cell carcinoma with a nodular pattern.  Of note, the patient has a history of SBCC on the left superior central posterior tibia that was treated with E,D&C on 05/02/2019.  She has discussed Mohs surgery with tandem plastic surgery with subspecialists.  She understands that surgery could cause a scar over the skin, possible but not guaranteed hair loss of the eyebrow, and possible eyebrow lift which, to her understanding, would largely resolve with time and yield satisfactory symmetry in the long-term.  She works in [employer].  PREVIOUS RADIATION THERAPY: No  PAST MEDICAL HISTORY:  has no past medical history on file.    PAST SURGICAL HISTORY: Past Surgical History:  Procedure Laterality Date  . AUGMENTATION MAMMAPLASTY      FAMILY HISTORY: family history is not on file.  SOCIAL HISTORY:  reports that she has never smoked. She has never used smokeless  tobacco.  ALLERGIES: Patient has no known allergies.  MEDICATIONS:  Current Outpatient Medications  Medication Sig Dispense Refill  . cyanocobalamin 1000 MCG tablet Take 1,000 mcg by mouth daily.    Marland Kitchen loratadine (CLARITIN) 10 MG tablet Take 10 mg by mouth daily.    . clindamycin (CLEOCIN T) 1 % external solution  (Patient not taking: Reported on 08/14/2020)    . clindamycin (CLEOCIN T) 1 % lotion  (Patient not taking: Reported on 08/14/2020)    . cyclobenzaprine (FLEXERIL) 10 MG tablet Take 10 mg by mouth at bedtime as needed. (Patient not taking: Reported on 08/14/2020)    . Dapsone 5 % topical gel  (Patient not taking: Reported on 08/14/2020)    . diclofenac sodium (VOLTAREN) 1 % GEL  (Patient not taking: Reported on 08/14/2020)    . doxycycline (VIBRAMYCIN) 100 MG capsule  (Patient not taking: Reported on 08/14/2020)    . FLUARIX QUADRIVALENT 0.5 ML injection TO BE ADMINISTERED BY PHARMACIST FOR IMMUNIZATION (Patient not taking: Reported on 08/14/2020)  0  . naproxen (NAPROSYN) 500 MG tablet  (Patient not taking: Reported on 08/14/2020)    . NEOMYCIN-POLYMYXIN-HYDROCORTISONE (CORTISPORIN) 1 % SOLN otic solution  (Patient not taking: Reported on 08/14/2020)    . nystatin-triamcinolone (MYCOLOG II) cream  (Patient not taking: Reported on 08/14/2020)    . spironolactone (ALDACTONE) 50 MG tablet  (Patient not taking: Reported on 08/14/2020)    . zolpidem (AMBIEN) 10 MG tablet Take 10 mg by mouth at bedtime as needed. (Patient not taking: Reported on 08/14/2020)  No current facility-administered medications for this encounter.    REVIEW OF SYSTEMS:  Notable for that above.   PHYSICAL EXAM:  vitals were not taken for this visit.   General: Alert and oriented, in no acute distress   Below are photographs provided by dermatology with editing by me in red.  Note that the red circle (drawn by me) estimates the electron radiation field and permanent hair loss would be expected inside the red  circle if she undergoes radiation therapy.     LABORATORY DATA:  Lab Results  Component Value Date   WBC 5.0 02/18/2011   HGB 14.6 02/18/2011   HCT 42.5 02/18/2011   MCV 90.8 02/18/2011   PLT 201 02/18/2011   CMP  No results found for: NA, K, CL, CO2, GLUCOSE, BUN, CREATININE, CALCIUM, PROT, ALBUMIN, AST, ALT, ALKPHOS, BILITOT, GFRNONAA, GFRAA       RADIOGRAPHY: No results found.    IMPRESSION/PLAN: Skin cancer  This is a delightful 44 year old woman with basal cell carcinoma above the left eyebrow.  We discussed the option of radiation therapy, with electrons, in detail, as a curative option.  She understands that the chance of cure with radiation therapy would be approximately 95%.  I would recommend that she receive treatment Monday through Friday for a 4-week course.  We talked about the logistics of radiation therapy in detail.  She understands that there are no guarantees of treatment, and that side effects could include but not necessarily be limited to skin irritation, hair loss, and fatigue.  She understands that skin irritation would most likely show significant resolution within a month or two of completing treatment.  There may be some permanent pigment changes which are usually mild.  The likelihood of a secondary medical malignancy is extremely low. We discussed that hair loss would be very likely, and permanent, at the medial aspect of the eyebrow, as estimated by the drawing above on the photograph.  She understands that temporary fatigue could also result during the course of radiation.  She asked many good questions that I answered to the best of my ability.  I recommended that she talk once more with her skin surgeons to determine if the potential side effects of surgery are less than the potential side effects of radiation therapy.  Given the extremely high likelihood of permanent hair loss at the medial left eyebrow, she will seriously consider the surgical options once  more.  At this point time she seems to be leaning towards surgery, but she knows to contact me if she has any more questions that she makes her decision.  I wished her the very best.   On date of service, in total, I spent 55 minutes on this encounter. This encounter was provided by telemedicine platform MyChart video.  The patient has given verbal consent for this type of encounter and has been advised to only accept a meeting of this type in a secure network environment. The attendants for this meeting include Eppie Gibson  and [employer].  During the encounter, Eppie Gibson was located at [facility] Radiation Oncology Department.  Marcene P Sanjurjo was located at home.     __________________________________________   Eppie Gibson, MD  This document serves as a record of services personally performed by Eppie Gibson, MD. It was created on his behalf by Clerance Lav, a trained medical scribe. The creation of this record is based on the scribe's personal observations and the provider's statements to them.  This document has been checked and approved by the attending provider.

## 2020-09-09 DIAGNOSIS — R208 Other disturbances of skin sensation: Secondary | ICD-10-CM | POA: Diagnosis not present

## 2020-09-09 DIAGNOSIS — C44319 Basal cell carcinoma of skin of other parts of face: Secondary | ICD-10-CM | POA: Diagnosis not present

## 2020-09-10 DIAGNOSIS — S0180XA Unspecified open wound of other part of head, initial encounter: Secondary | ICD-10-CM | POA: Diagnosis not present

## 2020-09-10 DIAGNOSIS — C44319 Basal cell carcinoma of skin of other parts of face: Secondary | ICD-10-CM | POA: Diagnosis not present

## 2020-09-16 DIAGNOSIS — L905 Scar conditions and fibrosis of skin: Secondary | ICD-10-CM | POA: Diagnosis not present

## 2020-10-08 DIAGNOSIS — H60332 Swimmer's ear, left ear: Secondary | ICD-10-CM | POA: Diagnosis not present

## 2020-10-21 DIAGNOSIS — L905 Scar conditions and fibrosis of skin: Secondary | ICD-10-CM | POA: Diagnosis not present

## 2020-12-04 DIAGNOSIS — Z111 Encounter for screening for respiratory tuberculosis: Secondary | ICD-10-CM | POA: Diagnosis not present

## 2020-12-10 DIAGNOSIS — Z1231 Encounter for screening mammogram for malignant neoplasm of breast: Secondary | ICD-10-CM | POA: Diagnosis not present

## 2020-12-10 DIAGNOSIS — Z01419 Encounter for gynecological examination (general) (routine) without abnormal findings: Secondary | ICD-10-CM | POA: Diagnosis not present

## 2020-12-10 DIAGNOSIS — Z681 Body mass index (BMI) 19 or less, adult: Secondary | ICD-10-CM | POA: Diagnosis not present

## 2021-02-03 DIAGNOSIS — L905 Scar conditions and fibrosis of skin: Secondary | ICD-10-CM | POA: Diagnosis not present

## 2021-02-03 DIAGNOSIS — C44319 Basal cell carcinoma of skin of other parts of face: Secondary | ICD-10-CM | POA: Diagnosis not present

## 2021-02-21 DIAGNOSIS — H52203 Unspecified astigmatism, bilateral: Secondary | ICD-10-CM | POA: Diagnosis not present

## 2021-02-21 DIAGNOSIS — H524 Presbyopia: Secondary | ICD-10-CM | POA: Diagnosis not present

## 2021-02-21 DIAGNOSIS — H04123 Dry eye syndrome of bilateral lacrimal glands: Secondary | ICD-10-CM | POA: Diagnosis not present

## 2021-03-25 DIAGNOSIS — B379 Candidiasis, unspecified: Secondary | ICD-10-CM | POA: Diagnosis not present

## 2021-03-25 DIAGNOSIS — T3695XA Adverse effect of unspecified systemic antibiotic, initial encounter: Secondary | ICD-10-CM | POA: Diagnosis not present

## 2021-03-25 DIAGNOSIS — J0140 Acute pansinusitis, unspecified: Secondary | ICD-10-CM | POA: Diagnosis not present

## 2021-04-02 DIAGNOSIS — J069 Acute upper respiratory infection, unspecified: Secondary | ICD-10-CM | POA: Diagnosis not present

## 2021-04-02 DIAGNOSIS — R051 Acute cough: Secondary | ICD-10-CM | POA: Diagnosis not present

## 2021-05-21 DIAGNOSIS — R1031 Right lower quadrant pain: Secondary | ICD-10-CM | POA: Diagnosis not present

## 2021-05-21 DIAGNOSIS — R109 Unspecified abdominal pain: Secondary | ICD-10-CM | POA: Diagnosis not present

## 2021-05-21 DIAGNOSIS — R300 Dysuria: Secondary | ICD-10-CM | POA: Diagnosis not present

## 2021-05-21 DIAGNOSIS — R350 Frequency of micturition: Secondary | ICD-10-CM | POA: Diagnosis not present

## 2021-05-22 DIAGNOSIS — R109 Unspecified abdominal pain: Secondary | ICD-10-CM | POA: Diagnosis not present

## 2021-05-22 DIAGNOSIS — R350 Frequency of micturition: Secondary | ICD-10-CM | POA: Diagnosis not present

## 2021-05-27 DIAGNOSIS — R102 Pelvic and perineal pain: Secondary | ICD-10-CM | POA: Diagnosis not present

## 2021-05-27 DIAGNOSIS — R1031 Right lower quadrant pain: Secondary | ICD-10-CM | POA: Diagnosis not present

## 2021-08-05 DIAGNOSIS — Z23 Encounter for immunization: Secondary | ICD-10-CM | POA: Diagnosis not present

## 2021-12-09 DIAGNOSIS — Z111 Encounter for screening for respiratory tuberculosis: Secondary | ICD-10-CM | POA: Diagnosis not present

## 2021-12-16 DIAGNOSIS — Z01419 Encounter for gynecological examination (general) (routine) without abnormal findings: Secondary | ICD-10-CM | POA: Diagnosis not present

## 2021-12-16 DIAGNOSIS — Z1231 Encounter for screening mammogram for malignant neoplasm of breast: Secondary | ICD-10-CM | POA: Diagnosis not present

## 2021-12-16 DIAGNOSIS — Z124 Encounter for screening for malignant neoplasm of cervix: Secondary | ICD-10-CM | POA: Diagnosis not present

## 2021-12-16 DIAGNOSIS — Z681 Body mass index (BMI) 19 or less, adult: Secondary | ICD-10-CM | POA: Diagnosis not present

## 2022-01-14 DIAGNOSIS — H66002 Acute suppurative otitis media without spontaneous rupture of ear drum, left ear: Secondary | ICD-10-CM | POA: Diagnosis not present

## 2022-03-20 DIAGNOSIS — H52203 Unspecified astigmatism, bilateral: Secondary | ICD-10-CM | POA: Diagnosis not present

## 2022-03-20 DIAGNOSIS — H04123 Dry eye syndrome of bilateral lacrimal glands: Secondary | ICD-10-CM | POA: Diagnosis not present

## 2022-03-20 DIAGNOSIS — H524 Presbyopia: Secondary | ICD-10-CM | POA: Diagnosis not present

## 2022-08-10 DIAGNOSIS — D2239 Melanocytic nevi of other parts of face: Secondary | ICD-10-CM | POA: Diagnosis not present

## 2022-08-10 DIAGNOSIS — D225 Melanocytic nevi of trunk: Secondary | ICD-10-CM | POA: Diagnosis not present

## 2022-08-10 DIAGNOSIS — Z85828 Personal history of other malignant neoplasm of skin: Secondary | ICD-10-CM | POA: Diagnosis not present

## 2022-08-10 DIAGNOSIS — L918 Other hypertrophic disorders of the skin: Secondary | ICD-10-CM | POA: Diagnosis not present

## 2022-11-13 DIAGNOSIS — Z681 Body mass index (BMI) 19 or less, adult: Secondary | ICD-10-CM | POA: Diagnosis not present

## 2022-11-13 DIAGNOSIS — H10021 Other mucopurulent conjunctivitis, right eye: Secondary | ICD-10-CM | POA: Diagnosis not present

## 2022-12-15 DIAGNOSIS — Z111 Encounter for screening for respiratory tuberculosis: Secondary | ICD-10-CM | POA: Diagnosis not present

## 2022-12-18 DIAGNOSIS — Z1231 Encounter for screening mammogram for malignant neoplasm of breast: Secondary | ICD-10-CM | POA: Diagnosis not present

## 2022-12-18 DIAGNOSIS — Z01419 Encounter for gynecological examination (general) (routine) without abnormal findings: Secondary | ICD-10-CM | POA: Diagnosis not present

## 2022-12-18 DIAGNOSIS — Z681 Body mass index (BMI) 19 or less, adult: Secondary | ICD-10-CM | POA: Diagnosis not present

## 2022-12-18 DIAGNOSIS — Z124 Encounter for screening for malignant neoplasm of cervix: Secondary | ICD-10-CM | POA: Diagnosis not present

## 2023-01-04 DIAGNOSIS — Z1211 Encounter for screening for malignant neoplasm of colon: Secondary | ICD-10-CM | POA: Diagnosis not present

## 2023-01-06 DIAGNOSIS — M2392 Unspecified internal derangement of left knee: Secondary | ICD-10-CM | POA: Diagnosis not present

## 2023-01-19 DIAGNOSIS — M2392 Unspecified internal derangement of left knee: Secondary | ICD-10-CM | POA: Diagnosis not present

## 2023-02-08 DIAGNOSIS — M2242 Chondromalacia patellae, left knee: Secondary | ICD-10-CM | POA: Diagnosis not present

## 2023-03-03 DIAGNOSIS — Z681 Body mass index (BMI) 19 or less, adult: Secondary | ICD-10-CM | POA: Diagnosis not present

## 2023-03-03 DIAGNOSIS — H9203 Otalgia, bilateral: Secondary | ICD-10-CM | POA: Diagnosis not present

## 2023-03-08 DIAGNOSIS — L700 Acne vulgaris: Secondary | ICD-10-CM | POA: Diagnosis not present

## 2023-03-08 DIAGNOSIS — Z85828 Personal history of other malignant neoplasm of skin: Secondary | ICD-10-CM | POA: Diagnosis not present

## 2023-04-07 DIAGNOSIS — N939 Abnormal uterine and vaginal bleeding, unspecified: Secondary | ICD-10-CM | POA: Diagnosis not present

## 2023-06-18 DIAGNOSIS — H04123 Dry eye syndrome of bilateral lacrimal glands: Secondary | ICD-10-CM | POA: Diagnosis not present

## 2023-11-02 DIAGNOSIS — Z681 Body mass index (BMI) 19 or less, adult: Secondary | ICD-10-CM | POA: Diagnosis not present

## 2023-11-02 DIAGNOSIS — J0140 Acute pansinusitis, unspecified: Secondary | ICD-10-CM | POA: Diagnosis not present

## 2023-11-02 DIAGNOSIS — H60392 Other infective otitis externa, left ear: Secondary | ICD-10-CM | POA: Diagnosis not present

## 2023-11-22 DIAGNOSIS — D2239 Melanocytic nevi of other parts of face: Secondary | ICD-10-CM | POA: Diagnosis not present

## 2023-11-22 DIAGNOSIS — L821 Other seborrheic keratosis: Secondary | ICD-10-CM | POA: Diagnosis not present

## 2023-11-22 DIAGNOSIS — Z85828 Personal history of other malignant neoplasm of skin: Secondary | ICD-10-CM | POA: Diagnosis not present

## 2023-11-22 DIAGNOSIS — D225 Melanocytic nevi of trunk: Secondary | ICD-10-CM | POA: Diagnosis not present

## 2023-12-27 DIAGNOSIS — R29898 Other symptoms and signs involving the musculoskeletal system: Secondary | ICD-10-CM | POA: Diagnosis not present

## 2023-12-27 DIAGNOSIS — R102 Pelvic and perineal pain: Secondary | ICD-10-CM | POA: Diagnosis not present

## 2024-01-17 DIAGNOSIS — Z124 Encounter for screening for malignant neoplasm of cervix: Secondary | ICD-10-CM | POA: Diagnosis not present

## 2024-01-17 DIAGNOSIS — Z01419 Encounter for gynecological examination (general) (routine) without abnormal findings: Secondary | ICD-10-CM | POA: Diagnosis not present

## 2024-01-17 DIAGNOSIS — Z681 Body mass index (BMI) 19 or less, adult: Secondary | ICD-10-CM | POA: Diagnosis not present

## 2024-01-17 DIAGNOSIS — Z1231 Encounter for screening mammogram for malignant neoplasm of breast: Secondary | ICD-10-CM | POA: Diagnosis not present

## 2024-02-16 DIAGNOSIS — J019 Acute sinusitis, unspecified: Secondary | ICD-10-CM | POA: Diagnosis not present

## 2024-02-16 DIAGNOSIS — R051 Acute cough: Secondary | ICD-10-CM | POA: Diagnosis not present

## 2024-02-28 DIAGNOSIS — Z0000 Encounter for general adult medical examination without abnormal findings: Secondary | ICD-10-CM | POA: Diagnosis not present

## 2024-02-28 DIAGNOSIS — Z111 Encounter for screening for respiratory tuberculosis: Secondary | ICD-10-CM | POA: Diagnosis not present

## 2024-04-08 DIAGNOSIS — M461 Sacroiliitis, not elsewhere classified: Secondary | ICD-10-CM | POA: Diagnosis not present

## 2024-04-08 DIAGNOSIS — M25552 Pain in left hip: Secondary | ICD-10-CM | POA: Diagnosis not present

## 2024-04-08 DIAGNOSIS — M549 Dorsalgia, unspecified: Secondary | ICD-10-CM | POA: Diagnosis not present

## 2024-05-29 DIAGNOSIS — H1013 Acute atopic conjunctivitis, bilateral: Secondary | ICD-10-CM | POA: Diagnosis not present

## 2024-05-30 DIAGNOSIS — H15101 Unspecified episcleritis, right eye: Secondary | ICD-10-CM | POA: Diagnosis not present

## 2024-07-12 DIAGNOSIS — H15101 Unspecified episcleritis, right eye: Secondary | ICD-10-CM | POA: Diagnosis not present

## 2024-09-06 DIAGNOSIS — H04123 Dry eye syndrome of bilateral lacrimal glands: Secondary | ICD-10-CM | POA: Diagnosis not present
